# Patient Record
Sex: Female | Born: 2000 | Race: White | Hispanic: No | Marital: Single | Smoking: Never smoker
Health system: Southern US, Community
[De-identification: ages and names within clinical notes are randomized; demographics above are authoritative.]

## PROBLEM LIST (undated history)

## (undated) DIAGNOSIS — R51 Headache: Secondary | ICD-10-CM

## (undated) DIAGNOSIS — B009 Herpesviral infection, unspecified: Secondary | ICD-10-CM

## (undated) HISTORY — DX: Herpesviral infection, unspecified: B00.9

## (undated) HISTORY — DX: Headache: R51

## (undated) HISTORY — PX: WISDOM TOOTH EXTRACTION: SHX21

---
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
## 2019-11-03 ENCOUNTER — Ambulatory Visit: Payer: No Typology Code available for payment source | Attending: Internal Medicine

## 2019-11-03 ENCOUNTER — Other Ambulatory Visit: Payer: Self-pay

## 2019-11-03 DIAGNOSIS — Z20822 Contact with and (suspected) exposure to covid-19: Secondary | ICD-10-CM

## 2019-11-04 LAB — NOVEL CORONAVIRUS, NAA: SARS-CoV-2, NAA: NOT DETECTED

## 2021-02-17 ENCOUNTER — Encounter (INDEPENDENT_AMBULATORY_CARE_PROVIDER_SITE_OTHER): Payer: Self-pay

## 2021-05-03 ENCOUNTER — Ambulatory Visit (INDEPENDENT_AMBULATORY_CARE_PROVIDER_SITE_OTHER): Payer: 59 | Admitting: Family

## 2021-05-03 ENCOUNTER — Encounter (INDEPENDENT_AMBULATORY_CARE_PROVIDER_SITE_OTHER): Payer: Self-pay | Admitting: Family

## 2021-05-03 ENCOUNTER — Other Ambulatory Visit: Payer: Self-pay

## 2021-05-03 VITALS — BP 110/66 | HR 84 | Wt 144.6 lb

## 2021-05-03 DIAGNOSIS — L813 Cafe au lait spots: Secondary | ICD-10-CM | POA: Diagnosis not present

## 2021-05-03 DIAGNOSIS — F419 Anxiety disorder, unspecified: Secondary | ICD-10-CM

## 2021-05-03 DIAGNOSIS — Q8501 Neurofibromatosis, type 1: Secondary | ICD-10-CM

## 2021-05-03 MED ORDER — ALPRAZOLAM 0.25 MG PO TABS: ORAL_TABLET | ORAL | 0 refills | Status: DC

## 2021-05-03 NOTE — Progress Notes (Signed)
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 She also has history of PCOS.   Today's concerns: Grace Strong reports today that she has had increase in number and size of cafe au lait spots. She has appropriate concerns about the increase in cafe au lait spots and wonders if the PCOS is related to this disorder.  Grace Strong is enrolled in an online college with a major in [place] music education. 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   Grace Strong has been otherwise generally healthy since she was last seen. She has no other health concerns today other than previously mentioned.  Review of systems: Please see HPI for neurologic and other pertinent review of systems. Otherwise all other systems were reviewed and were negative.  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  Surgical history: Past Surgical History:  Procedure Laterality  Date   WISDOM TOOTH EXTRACTION     2016     Family history: family history includes Alopecia in her mother; Dementia in her maternal grandfather and paternal grandfather; Infertility in her mother; Kidney Stones in her father; Thyroid disease in her unknown relative; Ulcerative colitis in her mother.   Social history: Social History   Socioeconomic History   Marital status: Single    Spouse name: Not on file   Number of children: Not on file   Years of education: Not on file   Highest education level: Not on file  Occupational History   Not on file  Tobacco Use   Smoking status: Never   Smokeless tobacco: Never  Substance and Sexual Activity   Alcohol use: No   Drug use: No   Sexual activity: Never  Other Topics Concern   Not on file  Social History Narrative   Grace Strong is a 52 th grade student, being home schooled. She does well in school.   Lives with her both parents and two sisters.    Social Determinants of Health   Financial Resource Strain: Not on file  Food Insecurity: Not on file  Transportation Needs: Not on file  Physical Activity: Not on file  Stress: Not on file  Social Connections: Not on file  Intimate Partner Violence: Not on file    Past/failed meds:  Allergies: Allergies  Allergen Reactions   Augmentin [Amoxicillin-Pot Clavulanate] Hives    Immunizations:  There is no immunization history on file for this patient.    Diagnostics/Screenings: Copied from previous record: 05/27/12 -  Normal MRI appearance of the brain.  No imaging findings of neurofibromatosis identified.  Physical Exam: BP 110/66   Pulse 84   Wt 144 lb 9.6 oz (65.6 kg)   LMP 04/19/2021   General: Well developed, well nourished, seated, in no evident distress,  brown hair, hazel eyes, right handed Head: Head normocephalic and atraumatic.  Oropharynx benign. Neck: Supple Cardiovascular: Regular rate and rhythm, no murmurs Respiratory: Breath sounds clear to  auscultation Musculoskeletal: No obvious deformities or scoliosis Skin: No rashes. She has large number of flat cafe au lait spots on her right trunk as well as axillary freckling  Neurologic Exam Mental Status: Awake and fully alert.  Oriented to place and time.  Recent and remote memory intact.  Attention span, concentration, and fund of knowledge appropriate.  Mood and affect appropriate. Cranial Nerves: Fundoscopic exam reveals sharp disc margins.  Pupils equal, briskly reactive to light.  Extraocular movements full without nystagmus.  Visual fields full to confrontation.  Hearing intact and symmetric to finger rub.  Facial sensation intact.  Face tongue, palate move normally and symmetrically.  Neck flexion and extension normal. Motor: Normal bulk and tone. Normal strength in all tested extremity muscles. Sensory: Intact to touch and temperature in all extremities.  Coordination: Rapid alternating movements normal in all extremities.  Finger-to-nose and heel-to shin performed accurately bilaterally.  Romberg negative. Gait and Station: Arises from chair without difficulty.  Stance is normal. Gait demonstrates normal stride length and balance.   Able to heel, toe and tandem walk without difficulty. Reflexes: 1+ and symmetric. Toes downgoing.   Impression: Neurofibromatosis, type 1 (HCC) - Plan: MR BRAIN W WO CONTRAST  Anxiety due to invasive procedure - Plan: ALPRAZolam (XANAX) 0.25 MG tablet  Cafe au lait spots   Recommendations for plan of care: The patient's previous [facility] records were reviewed. Grace Strong has neither had nor required imaging or lab studies since the last visit. She is a 20 year old young woman with history of neurofibromatosis type 1 based on a large number of cafe au lait spots. The lesions have increased in size and number. I am concerned about progression of her disorder and will order an MRI of the brain to evaluate for mass or lesions. She is anxious about the procedure  and I have ordered low dose Alprazolam for her to take prior to the examination. will call Grace Strong when the results are available to me. I will otherwise plan to see Grace Strong back in follow up in 1 year or sooner if needed.   The medication list was reviewed and reconciled. I reviewed changes that were made in the prescribed medications today. A complete medication list was provided to the patient.  Orders Placed This Encounter  Procedures   MR BRAIN W WO CONTRAST    Perform MRI of brain w/wo contrast for patient with known history of neurofibromatosis. Last MRI brain was in 2013. She now has increased number of cafe au lait spots on right trunk and right axillary freckling. Evaluate for mass or lesion.    Standing Status:   Future    Standing Expiration Date:   08/03/2021    Order Specific Question:   If indicated for the ordered procedure, I authorize the administration of contrast media per Radiology protocol    Answer:   Yes    Order Specific Question:   What is the patient's sedation requirement?    Answer:   Anti-anxiety    Order Specific Question:   Does  the patient have a pacemaker or implanted devices?    Answer:   No    Order Specific Question:   Preferred imaging location?    Answer:   [facility] (table limit-550lbs)    Return in about 1 year (around 05/03/2022).   Allergies as of 05/03/2021       Reactions   Augmentin [amoxicillin-pot Clavulanate] Hives   Penicillins Hives   Phenol         Medication List        Accurate as of May 03, 2021 11:59 PM. If you have any questions, ask your nurse or doctor.          acyclovir 200 MG capsule Commonly known as: ZOVIRAX Take 200 mg by mouth as needed.   ALPRAZolam 0.25 MG tablet Commonly known as: Xanax Take 1 tablet 30 minutes prior to procedure. Take additional 1 tablet upon arrival at facility. What changed: additional instructions Changed by: Elveria Rising, NP   Inositol 324 MG Tabs Take as directed  for PCOS What changed: Another medication with the same name was removed. Continue taking this medication, and follow the directions you see here. Changed by: Elveria Rising, NP   Junel FE 1/20 1-20 MG-MCG tablet Generic drug: norethindrone-ethinyl estradiol-FE Take 1 tablet by mouth daily.   multivitamin with minerals tablet Take 1 tablet by mouth daily.   valACYclovir 1000 MG tablet Commonly known as: VALTREX Take 1,000 mg by mouth 2 (two) times daily as needed.   Vitamin D (Ergocalciferol) 1.25 MG (50000 UNIT) Caps capsule Commonly known as: DRISDOL Take 5,000 Units by mouth 3 (three) times a week.        Total time spent with the patient was 20 minutes, of which 50% or more was spent in counseling and coordination of care.  Elveria Rising NP-C [facility] Ph. [phone] Fax [phone]

## 2021-05-03 NOTE — Patient Instructions (Signed)
Thank you for coming in today.   Instructions for you until your next appointment are as follows: I have ordered an MRI of the brain. We will need to get insurance approval, then you will be contacted by the facility to schedule the procedure. Getting insurance approval can take several days to a week, so if you haven't heard anything from them in a week, let me know.  I sent in a prescription for Alprazolam (Xanax) to take 1 tablet before you leave your home for the MRI and then take 1 when you arrive at the MRI facility. This should help you to be calm and able to have the test done.  I will call you when I receive the MRI results.  Please sign up for MyChart if you have not done so. Please plan to return for follow up in one year or sooner if needed.  At [facility], we are committed to providing exceptional care. You will receive a patient satisfaction survey through text or email regarding your visit today. Your opinion is important to me. Comments are appreciated.

## 2021-05-08 ENCOUNTER — Encounter (INDEPENDENT_AMBULATORY_CARE_PROVIDER_SITE_OTHER): Payer: Self-pay | Admitting: Family

## 2021-05-08 DIAGNOSIS — L813 Cafe au lait spots: Secondary | ICD-10-CM | POA: Insufficient documentation

## 2021-05-08 DIAGNOSIS — F419 Anxiety disorder, unspecified: Secondary | ICD-10-CM | POA: Insufficient documentation

## 2021-05-21 ENCOUNTER — Ambulatory Visit
Admission: RE | Admit: 2021-05-21 | Discharge: 2021-05-21 | Disposition: A | Discharge status: Home / Self Care | Payer: 59 | Source: Ambulatory Visit | Attending: Family | Admitting: Family

## 2021-05-21 ENCOUNTER — Other Ambulatory Visit: Payer: Self-pay

## 2021-05-21 DIAGNOSIS — Q8501 Neurofibromatosis, type 1: Secondary | ICD-10-CM

## 2021-05-21 MED ORDER — GADOBENATE DIMEGLUMINE 529 MG/ML IV SOLN
13.0000 mL | Freq: Once | INTRAVENOUS | Status: AC | PRN
  Administered 2021-05-21: 13 mL via INTRAVENOUS

## 2022-04-13 DIAGNOSIS — Z130 Encounter for screening for diseases of the blood and blood-forming organs and certain disorders involving the immune mechanism: Secondary | ICD-10-CM | POA: Diagnosis not present

## 2022-04-13 DIAGNOSIS — Z1389 Encounter for screening for other disorder: Secondary | ICD-10-CM | POA: Diagnosis not present

## 2022-04-13 DIAGNOSIS — Z01419 Encounter for gynecological examination (general) (routine) without abnormal findings: Secondary | ICD-10-CM | POA: Diagnosis not present

## 2022-04-13 DIAGNOSIS — Z3041 Encounter for surveillance of contraceptive pills: Secondary | ICD-10-CM | POA: Diagnosis not present

## 2022-07-03 DIAGNOSIS — L245 Irritant contact dermatitis due to other chemical products: Secondary | ICD-10-CM | POA: Diagnosis not present

## 2022-07-03 DIAGNOSIS — L239 Allergic contact dermatitis, unspecified cause: Secondary | ICD-10-CM | POA: Diagnosis not present

## 2022-07-03 DIAGNOSIS — L309 Dermatitis, unspecified: Secondary | ICD-10-CM | POA: Diagnosis not present

## 2022-07-03 DIAGNOSIS — L280 Lichen simplex chronicus: Secondary | ICD-10-CM | POA: Diagnosis not present

## 2022-07-03 DIAGNOSIS — L308 Other specified dermatitis: Secondary | ICD-10-CM | POA: Diagnosis not present

## 2022-07-03 DIAGNOSIS — L738 Other specified follicular disorders: Secondary | ICD-10-CM | POA: Diagnosis not present

## 2022-08-15 DIAGNOSIS — L853 Xerosis cutis: Secondary | ICD-10-CM | POA: Diagnosis not present

## 2022-08-15 DIAGNOSIS — L738 Other specified follicular disorders: Secondary | ICD-10-CM | POA: Diagnosis not present

## 2022-08-15 DIAGNOSIS — L309 Dermatitis, unspecified: Secondary | ICD-10-CM | POA: Diagnosis not present

## 2022-10-30 ENCOUNTER — Ambulatory Visit (INDEPENDENT_AMBULATORY_CARE_PROVIDER_SITE_OTHER): Payer: 59 | Admitting: Family

## 2022-11-10 IMAGING — MR MR HEAD WO/W CM
12 series · 48 of 48 positions shown · IV contrast (multihance)
Comparison: 05/27/2012

CLINICAL DATA: Neurofibromatosis type 1.

EXAM:
MRI HEAD WITHOUT AND WITH CONTRAST
TECHNIQUE: Multiplanar, multiecho pulse sequences of the brain and surrounding
structures were obtained without and with intravenous contrast.
CONTRAST:  13mL MULTIHANCE GADOBENATE DIMEGLUMINE 529 MG/ML IV SOLN

[Series 2: T1 · sagittal · 5.0mm · 0.45mm/px · 1 of 25 slices shown]
[im 1/25]
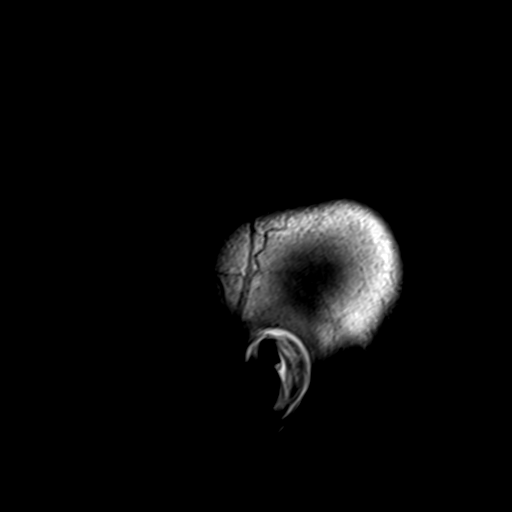

[Series 3: ax ep2d_diff_3 · axial · 3.0mm · 1.80mm/px · z∈[-110,+49]mm · 6 of 110 slices shown]
[im 1/110]
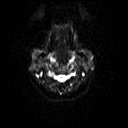
[im 22/110]
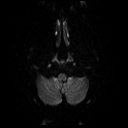
[im 44/110]
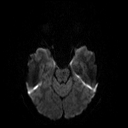
[im 66/110]
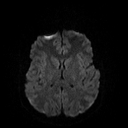
[im 88/110]
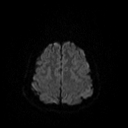
[im 110/110]
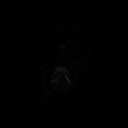

[Series 4: ax ep2d_diff_3_adc · axial · 3.0mm · 1.80mm/px · z∈[-110,+49]mm · 3 of 55 slices shown]
[im 1/55]
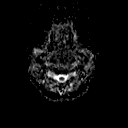
[im 28/55]
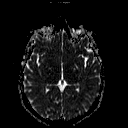
[im 55/55]
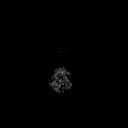

[Series 5: cor ep2d_diff · coronal · 5.0mm · 1.77mm/px · 3 of 51 slices shown]
[im 1/51]
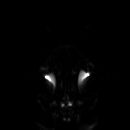
[im 26/51]
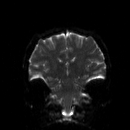
[im 51/51]
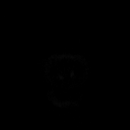

[Series 6: cor ep2d_diff_adc · coronal · 5.0mm · 1.77mm/px · 2 of 27 slices shown]
[im 1/27]
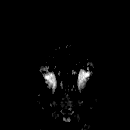
[im 27/27]
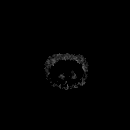

[Series 8: swi_images · axial · 2.0mm · 0.98mm/px · z∈[-110,+45]mm · 5 of 80 slices shown]
[im 1/80]
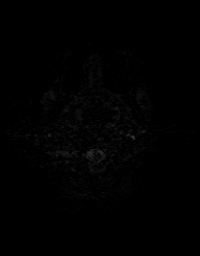
[im 20/80]
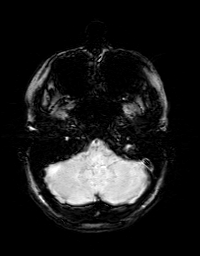
[im 40/80]
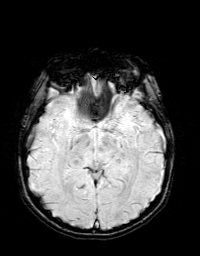
[im 60/80]
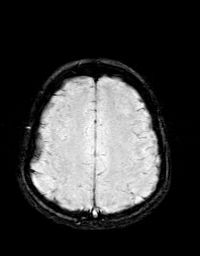
[im 80/80]
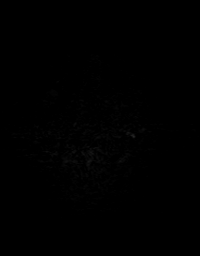

[Series 9: FLAIR · axial · 3.0mm · 0.43mm/px · z∈[-104,+45]mm · 2 of 40 slices shown]
[im 1/40]
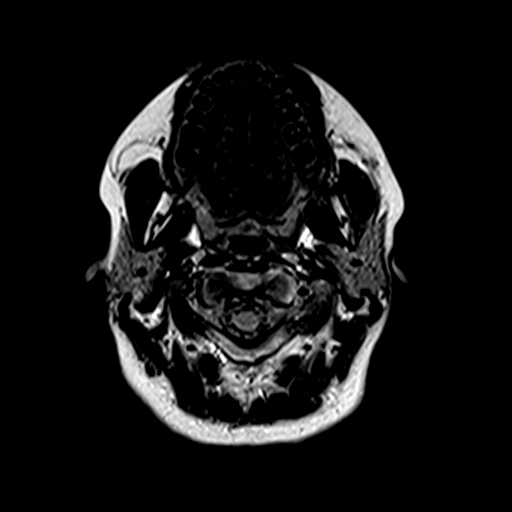
[im 40/40]
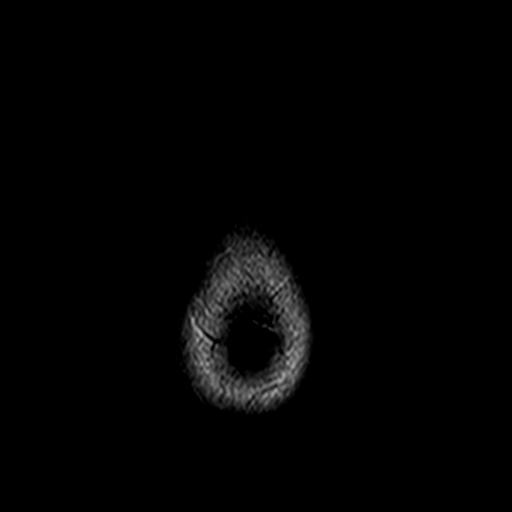

[Series 10: T2 · axial · 5.0mm · 0.65mm/px · z∈[-115,+50]mm · 2 of 29 slices shown (1 of 2)]
[im 1/29]
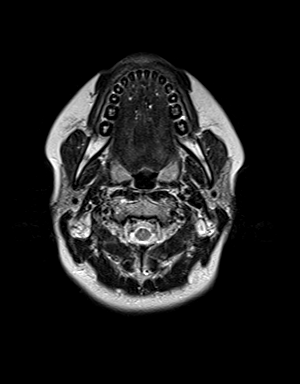
[im 29/29]
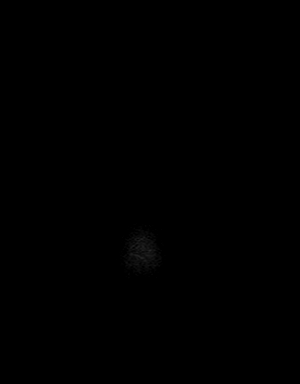

[Series 11: t1_mpr_tra · axial · 1.0mm · 0.72mm/px · z∈[-108,+48]mm · 10 of 160 slices shown]
[im 1/160]
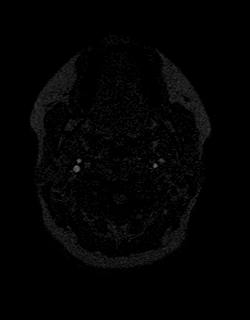
[im 18/160]
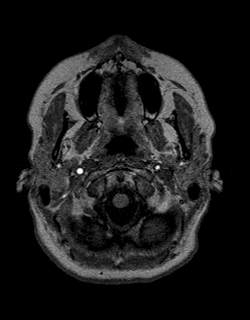
[im 36/160]
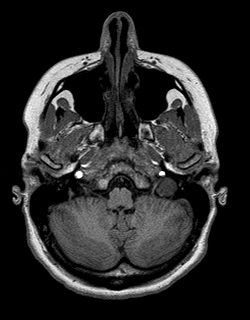
[im 54/160]
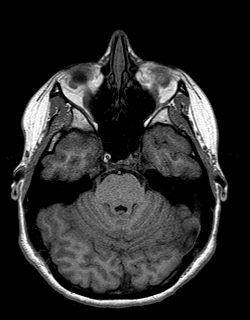
[im 71/160]
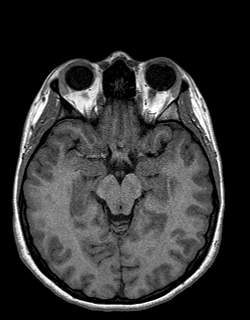
[im 89/160]
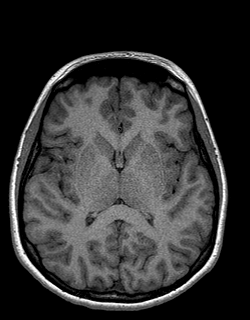
[im 107/160]
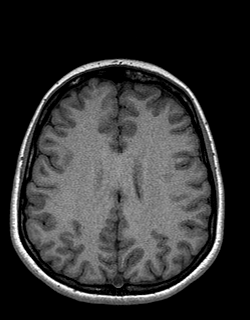
[im 124/160]
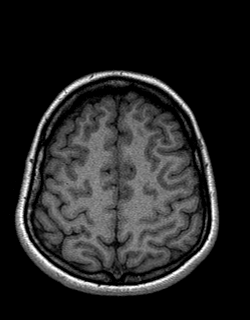
[im 142/160]
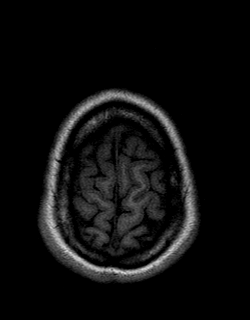
[im 160/160]
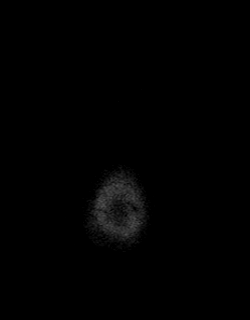

[Series 12: T2 · coronal · 5.0mm · 0.45mm/px · 2 of 31 slices shown (2 of 2)]
[im 1/31]
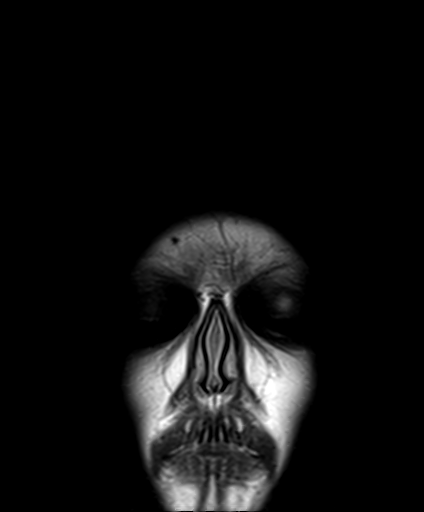
[im 31/31]
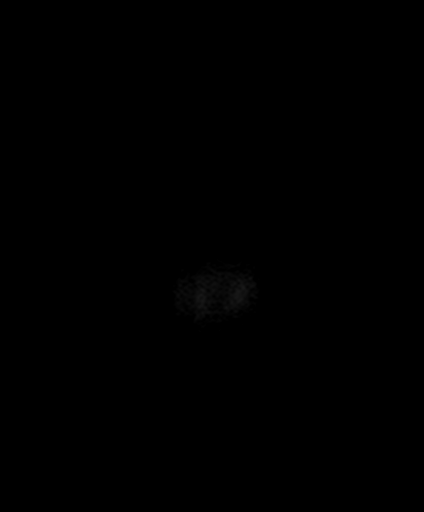

[Series 13: T1 post-contrast · coronal · 5.0mm · 0.72mm/px · 2 of 26 slices shown]
[im 1/26]
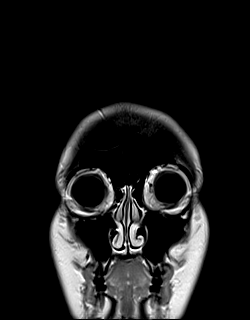
[im 26/26]
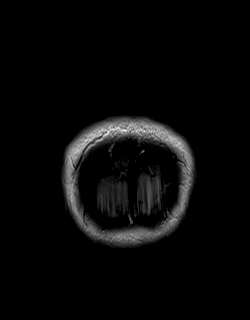

[Series 14: post t1_mpr_tra · axial · 1.0mm · 0.72mm/px · z∈[-108,+48]mm · 10 of 160 slices shown]
[im 1/160]
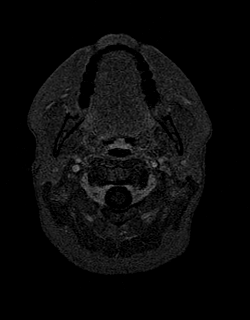
[im 18/160]
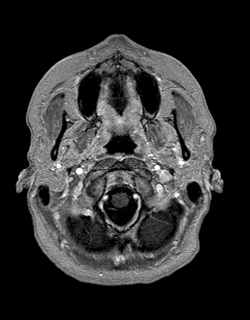
[im 36/160]
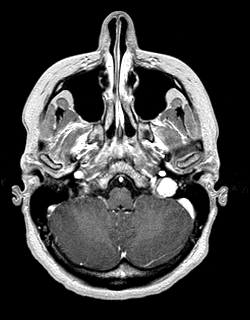
[im 54/160]
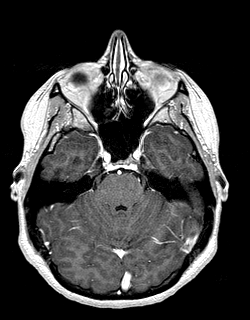
[im 71/160]
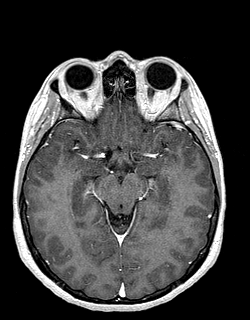
[im 89/160]
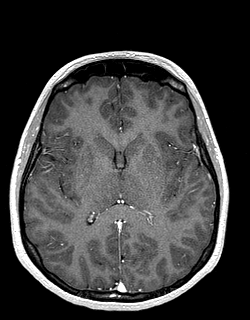
[im 107/160]
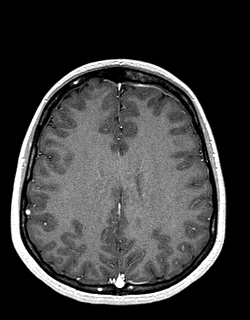
[im 124/160]
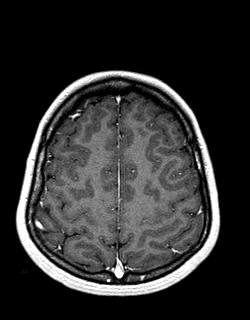
[im 142/160]
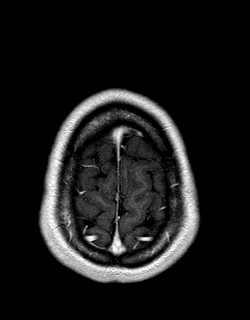
[im 160/160]
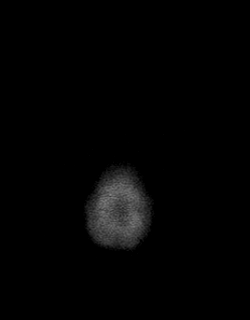

[48 of 48 positions shown; findings below may reference images not displayed]

FINDINGS: Brain: No infarction, hemorrhage, hydrocephalus, extra-axial
collection or mass lesion. No white matter disease or atrophy.

Vascular: Normal flow voids and vessel enhancements

Skull and upper cervical spine: Normal marrow signal

Sinuses/Orbits: New T2 hyperintensity in the left petrous apex
without septal loss, T1 hyperintensity, or restricted diffusion. No
evidence of acute inflammation on postcontrast imaging.
IMPRESSION: 1. Normal MRI of the brain.
2. Interval trapped fluid in the left petrous apex.

## 2022-11-20 ENCOUNTER — Ambulatory Visit (INDEPENDENT_AMBULATORY_CARE_PROVIDER_SITE_OTHER): Payer: 59 | Admitting: Family

## 2022-11-20 ENCOUNTER — Encounter (INDEPENDENT_AMBULATORY_CARE_PROVIDER_SITE_OTHER): Payer: Self-pay | Admitting: Family

## 2022-11-20 VITALS — BP 110/74 | HR 80 | Ht 68.11 in | Wt 146.4 lb

## 2022-11-20 DIAGNOSIS — Q8501 Neurofibromatosis, type 1: Secondary | ICD-10-CM | POA: Diagnosis not present

## 2022-11-20 DIAGNOSIS — G44219 Episodic tension-type headache, not intractable: Secondary | ICD-10-CM | POA: Diagnosis not present

## 2022-11-20 DIAGNOSIS — L813 Cafe au lait spots: Secondary | ICD-10-CM | POA: Diagnosis not present

## 2022-11-20 NOTE — Patient Instructions (Signed)
It was a pleasure to see you today!  Instructions for you until your next appointment are as follows: I will refer you to [facility] for adult neurology care. Their phone number is [phone] Call me for any concerns that may arise prior to your transition to that practice. Please sign up for MyChart if you have not done so.   Feel free to contact our office during normal business hours at [phone] with questions or concerns. If there is no answer or the call is outside business hours, please leave a message and our clinic staff will call you back within the next business day.  If you have an urgent concern, please stay on the line for our after-hours answering service and ask for the on-call neurologist.     I also encourage you to use MyChart to communicate with me more directly. If you have not yet signed up for MyChart within [facility], the front desk staff can help you. However, please note that this inbox is NOT monitored on nights or weekends, and response can take up to 2 business days.  Urgent matters should be discussed with the on-call pediatric neurologist.   At [facility], we are committed to providing exceptional care. You will receive a patient satisfaction survey through text or email regarding your visit today. Your opinion is important to me. Comments are appreciated.

## 2022-11-20 NOTE — Progress Notes (Signed)
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 She also has history of PCOS.    Today's concerns: Grace Strong feels that she has a few more spots on her right trunk but they are very small and not raised. None of the prior spots or axillary freckling has enlarged or changed.  Has occasional tension headaches but attributes to busy college and work schedule.  Will graduate college in May. 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  Has known PCOS and is taking oral contraceptives for that. Grace Strong has been otherwise generally healthy since she was last seen. No health concerns today other than previously mentioned.  Review of systems: Please see HPI for neurologic and other pertinent review of systems. Otherwise all other systems were reviewed and were negative.  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   Surgical history: Past Surgical History:  Procedure Laterality Date   WISDOM TOOTH EXTRACTION     2016     Family history: family history includes Alopecia in her mother; Dementia in her maternal grandfather and paternal grandfather; Infertility in her mother; Kidney Stones in her father; Thyroid disease in an other family member; Ulcerative colitis in her mother.   Social history: Social History   Socioeconomic History   Marital status: Single    Spouse name: Not on file   Number of children: Not on file   Years of education: Not on file   Highest education level: Not on file  Occupational History   Not on file  Tobacco Use   Smoking status: Never   Smokeless tobacco: Never  Substance and Sexual Activity   Alcohol use: No   Drug use: No   Sexual activity: Never  Other Topics Concern   Not on file  Social History Narrative   Grace Strong is attending [employer] for [employer] and [employer].    She lives with mom, dad, and 2 sisters.    Social Determinants of Health   Financial Resource Strain: Not on file  Food Insecurity: Not on file  Transportation Needs: Not on file  Physical Activity: Not on file  Stress: Not on file  Social Connections: Not on file  Intimate Partner Violence: Not on file    Past/failed meds:  Allergies: Allergies  Allergen Reactions   Augmentin [Amoxicillin-Pot Clavulanate] Hives  Penicillins Hives   Phenol     Immunizations:  There is no immunization history on file for this patient.    Diagnostics/Screenings: Copied from previous record: 05/21/2021 - MRI brain wo contrast -  1. Normal MRI of the brain. 2. Interval trapped fluid in the left petrous apex.  05/27/12 - MRI brwon wo contrast - Normal MRI appearance of the brain.  No imaging findings of neurofibromatosis identified.   Physical Exam: BP 110/74 (BP Location: Left Arm, Patient Position: Sitting, Cuff Size: Normal)    Pulse 80   Ht 5' 8.11" (1.73 m)   Wt 146 lb 6.4 oz (66.4 kg)   BMI 22.19 kg/m   General: Well developed, well nourished young woman, seated on exam table, in no evident distress Head: Head normocephalic and atraumatic.  Oropharynx benign. Neck: Supple Cardiovascular: Regular rate and rhythm, no murmurs Respiratory: Breath sounds clear to auscultation Musculoskeletal: No obvious deformities or scoliosis Skin: No rashes. Has scattered cafe au lait spots on her right trunk as well as right axillary freckling  Neurologic Exam Mental Status: Awake and fully alert.  Oriented to place and time.  Recent and remote memory intact.  Attention span, concentration, and fund of knowledge appropriate.  Mood and affect appropriate. Cranial Nerves: Fundoscopic exam reveals sharp disc margins.  Pupils equal, briskly reactive to light.  Extraocular movements full without nystagmus. Hearing intact and symmetric to whisper.  Facial sensation intact.  Face tongue, palate move normally and symmetrically. Shoulder shrug normal Motor: Normal bulk and tone. Normal strength in all tested extremity muscles. Sensory: Intact to touch and temperature in all extremities.  Coordination: Rapid alternating movements normal in all extremities.  Finger-to-nose and heel-to shin performed accurately bilaterally.  Romberg negative. Gait and Station: Arises from chair without difficulty.  Stance is normal. Gait demonstrates normal stride length and balance.   Able to heel, toe and tandem walk without difficulty. Reflexes: 1+ and symmetric. Toes downgoing.   Impression: Neurofibromatosis, type 1 ([place]) - Plan: Ambulatory referral to Neurology  Episodic tension-type headache, not intractable - Plan: Ambulatory referral to Neurology  Cafe au lait spots - Plan: Ambulatory referral to Neurology   Recommendations for plan of care: The patient's previous Epic records were reviewed. No recent diagnostic studies to be reviewed with the  patient.  Plan until next visit: Continue monitoring cafe-au-lait spots Call for any concerns Will refer to [facility] for transfer of care to adult neurology provider  The medication list was reviewed and reconciled. No changes were made in the prescribed medications today. A complete medication list was provided to the patient.  Orders Placed This Encounter  Procedures   Ambulatory referral to Neurology    Referral Priority:   Routine    Referral Type:   Consultation    Referral Reason:   Specialty Services Required    Requested Specialty:   Neurology    Number of Visits Requested:   1    Allergies as of 11/20/2022       Reactions   Augmentin [amoxicillin-pot Clavulanate] Hives   Penicillins Hives   Phenol         Medication List        Accurate as of November 20, 2022 11:27 AM. If you have any questions, ask your nurse or doctor.          STOP taking these medications    acyclovir 200 MG capsule Commonly known as: ZOVIRAX Stopped by: Rockwell Germany, NP   ALPRAZolam 0.25  MG tablet Commonly known as: Xanax Stopped by: Rockwell Germany, NP   Inositol 324 MG Tabs Stopped by: Rockwell Germany, NP   valACYclovir 1000 MG tablet Commonly known as: VALTREX Stopped by: Rockwell Germany, NP       TAKE these medications    Junel FE 1/20 1-20 MG-MCG tablet Generic drug: norethindrone-ethinyl estradiol-FE Take 1 tablet by mouth daily.   multivitamin with minerals tablet Take 1 tablet by mouth daily.   Vitamin D (Ergocalciferol) 1.25 MG (50000 UNIT) Caps capsule Commonly known as: DRISDOL Take 5,000 Units by mouth 3 (three) times a week.      Total time spent with the patient was 20 minutes, of which 50% or more was spent in counseling and coordination of care.  Rockwell Germany NP-C [facility] [address], [place], [place] [address] Ph. [phone] Fax [phone]

## 2022-11-27 ENCOUNTER — Encounter (INDEPENDENT_AMBULATORY_CARE_PROVIDER_SITE_OTHER): Payer: Self-pay

## 2022-11-27 DIAGNOSIS — Q8501 Neurofibromatosis, type 1: Secondary | ICD-10-CM

## 2022-11-27 DIAGNOSIS — L813 Cafe au lait spots: Secondary | ICD-10-CM

## 2022-11-28 NOTE — Telephone Encounter (Signed)
I called and spoke with patient. I explained about genetic testing and recommended referral to Dr Retta Mac for genetics evaluation. She agreed with this plan. TG

## 2023-01-03 DIAGNOSIS — H109 Unspecified conjunctivitis: Secondary | ICD-10-CM | POA: Diagnosis not present

## 2023-01-03 DIAGNOSIS — J029 Acute pharyngitis, unspecified: Secondary | ICD-10-CM | POA: Diagnosis not present

## 2023-01-29 NOTE — Progress Notes (Signed)
MEDICAL GENETICS NEW PATIENT EVALUATION  Patient name: Grace Strong DOB: January 22, 2001 Age: 22 y.o. 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 female who presents today for an initial genetics evaluation for cafe au lait macules + axillary freckling (clinical dx NF1). She is accompanied by her mother at today's visit.  Grace Strong was clinically diagnosed with neurofibromatosis type 1 at 22 yo after she began developing multiple cafe au laits on her right trunk and right axillary freckling. The cafe au laits have increased in number (dozens) over time but slowed down in her teenage years. All are isolated to her right trunk. She has not had any neurofibroma growths. Grace Strong follows with neurology Elveria Rising, NP, planning to transfer to adult care at [facility]) and has had normal brain MRIs in 2022 and 2013. Last BP was normal. She has not had a recent eye exam and the family is unsure if she has Lisch nodules.   Grace Strong also has PCOS. She is currently on a low dose oral contraceptive- they have been treating conservatively due to concern that higher hormone levels (particularly progesterone) would cause neurofibroma growth. There are no developmental or learning concerns.  Prior genetic testing has not been performed. Grace Strong recently came across Legius syndrome and wondered if it was possible she could have this, rather than NF1. She also had questions about recurrence risk. She was therefore referred to Genetics.  Pregnancy/Birth History: Grace Strong was born to a then 23 year old G1P0 -> 1 mother. The pregnancy was conceived with aid of clomid and was uncomplicated. There were no exposures and labs were normal. Ultrasounds were normal. Amniotic fluid levels were normal. Fetal activity was  normal. No genetic testing was performed during the pregnancy.  Grace Strong was born at [redacted] week gestation at [facility] via vaginal delivery. There were complications- shoulder dystocia and right torticollis with flattening of right side of face, congenital agenesis of depressor angulare oris. Birth weight 7 lb 14 oz/3572 grams (75-90%), birth length and head circumference unknown. She did not require a NICU stay. She was discharged home a couple days after birth. She passed the newborn screen, hearing test and congenital heart screen.  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  Therapies -- none.  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  Social History: Social History   Social History Narrative   Wendy is attending [employer] [place] for Music and [employer].    She lives with mom, dad, and 2 sisters.  Medications: Current Outpatient Medications on File Prior to Visit  Medication Sig Dispense Refill   JUNEL FE 1/20 1-20 MG-MCG tablet Take 1 tablet by mouth daily.     Multiple Vitamins-Minerals (MULTIVITAMIN WITH MINERALS) tablet Take 1 tablet by mouth  daily.     spironolactone (ALDACTONE) 25 MG tablet Take 25 mg by mouth daily.     Vitamin D, Ergocalciferol, (DRISDOL) 50000 UNITS CAPS capsule Take 5,000 Units by mouth 3 (three) times a week.     No current facility-administered medications on file prior to visit.    Allergies:  Allergies  Allergen Reactions   Augmentin [Amoxicillin-Pot Clavulanate] Hives   Penicillins Hives   Phenol     Immunizations: up to date  Review of Systems: General: Normal growth and development. Eyes/vision: no concerns. No recent formal eye exam. Unsure if ever told she has Lisch nodules in the past. No known optic nerve issues. Ears/hearing: no concerns. Dental: sees dentist. No concerns. Respiratory: no concerns. Cardiovascular: no concerns. Gastrointestinal: no concerns. Genitourinary: no concerns. Endocrine: PCOS. Hematologic: no concerns. Immunologic: no concerns. Sick a lot recently in past 6 months. Neurological: no concerns. Normal brain MRIs. Psychiatric: no concerns, no learning issues or ADHD. Musculoskeletal: mild scoliosis. R breast smaller than L. 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 Skin, Hair, Nails: freckling throughout body. R axillary freckling, R trunk cafe au laits. Dermatologist is Dr. Amy Swaziland with [facility]. Cheeks used to always be red- had lasering done.  Family History: See pedigree below obtained during today's visit:    Notable family history: Areona is one of three children between her parents. She has a 72 yo sister who has bowel malrotation, GERD, and scoliosis that has been progressive and will likely require some form of intervention. There is a 76 yo sister who has juvenile arthritis. Neither sister has cafe au laits or freckling. There was a miscarriage in the sibship found to be triploidy. The mother is 55 yo, 5'4", and has ankylosing spondylosis. She has freckling in her right axilla; no cafe au laits; no  neurofibromas. The father is 44 yo, 6'4", and has hypertension and many moles.  Family history is notable for maternal grandmother who had a breast lump biopsied and found to be a neurofibroma; she does not otherwise have cafe au laits, freckling, or other neurofibromas. Both paternal aunts have Factor V Leiden. One aunt has severe endometriosis and a mesentery tumor. The paternal grandmother has had melanoma and breast cancer (diagnosed late 71s). The paternal grandmother has early onset Alzheimer's.   No one else in the family has been diagnosed with NF1 or has reported features that would be consistent with a clinical concern for NF1.  Mother's ethnicity: White Father's ethnicity: White Consanguinity: Denies, though parents are reportedly from the same community  Physical Examination: Weight: 67.5 kg (75-90%) Height: 5'8" (93%); mid-parental ~90% Head circumference: 56 cm (~75%)  Ht 5' 8.03" (1.728 m)   Wt 148 lb 12.8 oz (67.5 kg)   HC 56 cm (22.05")   BMI 22.60 kg/m   General: Alert, interactive Head: Normocephalic Eyes: Normoset, Normal lids, lashes, brows Nose: Normal appearance Lips/Mouth/Teeth: Normal appearance (no pigmentation differences within mouth or on insides of cheek) Ears: Normoset and normally formed, no pits, tags or creases Neck: Normal appearance Chest: No pectus deformities, right breast is smaller than left (she puts extra padding on the right side to fill out the bra cup) Heart: Warm and well perfused Lungs: No  increased work of breathing Skin:  Copious amounts of brown freckles primarily on right upper side of body -- arm/chest/abdomen/upper back/flank -- that very slightly cross midline towards the left on the anterior + posterior aspects of her trunk There are also many cafe au lait macules (definitely >6 that are >15 mm in diameter with smooth borders) scattered amongst the freckles on the right side of the body +Axillary freckling on the right side  only NO inguinal freckling bilaterally There are also scattered freckles on other areas of the body but more sporadic and not numerous No palpable neurofibromas or skin tags Hair: Normal anterior and posterior hairline, normal texture Neurologic: Normal gross motor by observation, no abnormal movements Psych: Age-appropriate interactions Back/spine: Very mild scoliosis Extremities: Symmetric and proportionate Hands/Feet: Normal hands, fingers and nails, 2 palmar creases bilaterally, Normal feet, toes and nails, No clinodactyly, syndactyly or polydactyly  Photos of patient in Epic (verbal consent obtained)  Prior Genetic testing: None  Pertinent Labs: None  Pertinent Imaging/Studies: Brain MRI 05/2021: normal brain  Assessment: OLETHA TOLSON is a 22 y.o. female with freckles and cafe au lait macules isolated to the right upper area of her body. These were first noticed at a young age and have grown in quantity/size as she has gotten older. She also has mild scoliosis and her right breast is smaller compared to the left. She is developmentally and intellectually typical without learning difficulties. She does not have macrocephaly. Family history is notable for her mother who has unilateral axillary freckling only as well as her maternal grandmother having a single neurofibroma in the breast (but no skin findings).  Cafe au lait macules can be seen in a variety of genetic disorders, including neurofibromatosis type 1, Legius syndrome, Noonan syndrome with multiple lentigines, Cowden syndrome, McCune Albright and Tuberous Sclerosis.    Neurofibromatosis 1 (NF1) should be suspected in individuals who have any of the following findings: Six or more caf au lait macules >5 mm in greatest diameter in prepubertal individuals and >15 mm in greatest diameter in postpubertal individuals Two or more neurofibromas of any type or one plexiform neurofibroma Freckling in the axillary or inguinal  regions Optic glioma Two or more Lisch nodules (iris hamartomas) A distinctive osseous lesion such as sphenoid dysplasia or tibial pseudarthrosis A first-degree relative (parent, sib, or offspring) with NF1 as defined by the above criteria   The NIH diagnostic criteria for NF1 are met in an individual who has two or more of the features OR one feature + a parent has a known diagnosis of NF1.   Verne technically does meet clinical criteria for NF1 (multiple cafe au lait macules, axillary freckling). We recommend testing of the NF1 gene in [place] to determine if we can identify a pathogenic variant that is the cause of her symptoms. There are other conditions associated with cafe au lait macules, including Legius syndrome (caused by variants in SPRED1). Given the overlap of this condition with NF1, we will test for variants in SPRED1 as well. If a variant is identified in SPRED1, then differing management recommendations may be provided because these individuals do not have risk of developing neurofibromas. I do not see evidence of other syndromes such as Noonan syndrome with multiple lentigines, Cowden syndrome, McCune Albright, and Tuberous Sclerosis so will defer additional assessment for these conditions at this time.  It is notable that the skin findings in Abriella appear to be isolated to the right trunk (anterior + posterior) and axilla of  her body. Some of the freckling on her stomach and back slightly cross midline but there is a distinct delineation. She has no left-sided axillary freckling, no inguinal freckling on either side, no h/o neurofibromas, no macrocephaly and no learning/behavior differences. This may be suggestive of mosaicism (segmental), in which a variant is present in only the cells of a certain region of the body, but not in the rest of the body. Given this, if a variant is not present in her buccal sample (which is highly likely since her buccal tissue appeared uninvolved), it is  possible the test could come back negative but there still be a variant in one of these genes present in other affected cell tissues that were not tested. In this case, testing an affected skin sample through skin biopsy could be considered, but risks of this procedure and consideration of how this would support management decisions should be carefully discussed. Ideally, the skin sample could be obtained through her Dermatologist and contain a cafe au lait region. 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    Recommendations: NF1 and SPRED1 single gene testing on a buccal sample If negative, can consider testing an affected skin sample (would need assistance through Dermatology for skin biopsy) If negative, Amirrah would still technically meet clinical criteria for NF1  A buccal sample was obtained during today's visit for the above genetic testing and sent to [facility]. Results are anticipated in 2-4 weeks. We will contact the family to discuss results once available and arrange follow-up as needed.    Charline Bills, MS, [facility] Certified Genetic Counselor  Loletha Grayer, D.O. Attending Physician, Medical [facility] Date: 02/13/2023 Time: 3:50pm   Total time spent: 90 minutes Time spent includes face to face and non-face to face care for the patient on the date of this encounter (history and physical, genetic counseling, coordination of care, data gathering and/or documentation as outlined)

## 2023-01-31 ENCOUNTER — Ambulatory Visit (INDEPENDENT_AMBULATORY_CARE_PROVIDER_SITE_OTHER): Payer: 59 | Admitting: Pediatric Genetics

## 2023-01-31 ENCOUNTER — Encounter (INDEPENDENT_AMBULATORY_CARE_PROVIDER_SITE_OTHER): Payer: Self-pay | Admitting: Pediatric Genetics

## 2023-01-31 VITALS — Ht 68.03 in | Wt 148.8 lb

## 2023-01-31 DIAGNOSIS — L813 Cafe au lait spots: Secondary | ICD-10-CM | POA: Diagnosis not present

## 2023-01-31 DIAGNOSIS — M419 Scoliosis, unspecified: Secondary | ICD-10-CM | POA: Diagnosis not present

## 2023-01-31 DIAGNOSIS — L812 Freckles: Secondary | ICD-10-CM | POA: Diagnosis not present

## 2023-01-31 NOTE — Patient Instructions (Addendum)
At [facility], we are committed to providing exceptional care. You will receive a patient satisfaction survey through text or email regarding your visit today. Your opinion is important to me. Comments are appreciated.  Test ordered: NF1 gene and SPRED1 gene to [facility] on a cheek swab Result expected in 2-4 weeks  If normal, we can talk about testing an "affected" skin sample next.

## 2023-03-01 DIAGNOSIS — L812 Freckles: Secondary | ICD-10-CM | POA: Diagnosis not present

## 2023-03-01 DIAGNOSIS — L813 Cafe au lait spots: Secondary | ICD-10-CM | POA: Diagnosis not present

## 2023-03-08 ENCOUNTER — Telehealth (INDEPENDENT_AMBULATORY_CARE_PROVIDER_SITE_OTHER): Payer: Self-pay | Admitting: Genetic Counselor

## 2023-03-08 NOTE — Telephone Encounter (Signed)
Spoke to [employer] regarding result of genetic testing. Testing of the NF1 and SPRED1 genes through [facility] on a buccal swab was performed and was negative. This is unsurprising given that Grace Strong's skin findings are mainly isolated to the right trunk suggesting segmental mosaicism. We discussed the option of skin biopsy and genetic testing of the affected area, and Grace Strong is interested. We will coordinate a plan with her dermatologist, and I will discuss with Valecia when we have a plan. Grace Strong voiced understanding. The result was shared with her through the [facility] portal.  Charline Bills, [facility]

## 2023-03-14 ENCOUNTER — Telehealth (INDEPENDENT_AMBULATORY_CARE_PROVIDER_SITE_OTHER): Payer: Self-pay | Admitting: Pediatric Genetics

## 2023-03-14 NOTE — Telephone Encounter (Signed)
Left message for Dr. Amy Swaziland at [facility] to discuss possibility of obtaining an affected skin sample for genetic testing for Grace Strong.  Loletha Grayer, DO [facility]

## 2023-03-20 ENCOUNTER — Encounter (INDEPENDENT_AMBULATORY_CARE_PROVIDER_SITE_OTHER): Payer: Self-pay | Admitting: Pediatric Genetics

## 2023-04-24 DIAGNOSIS — Z3041 Encounter for surveillance of contraceptive pills: Secondary | ICD-10-CM | POA: Diagnosis not present

## 2023-04-24 DIAGNOSIS — Z01411 Encounter for gynecological examination (general) (routine) with abnormal findings: Secondary | ICD-10-CM | POA: Diagnosis not present

## 2023-04-24 DIAGNOSIS — L700 Acne vulgaris: Secondary | ICD-10-CM | POA: Diagnosis not present

## 2023-04-24 DIAGNOSIS — F418 Other specified anxiety disorders: Secondary | ICD-10-CM | POA: Diagnosis not present

## 2023-04-24 DIAGNOSIS — Z1389 Encounter for screening for other disorder: Secondary | ICD-10-CM | POA: Diagnosis not present

## 2023-06-20 DIAGNOSIS — E282 Polycystic ovarian syndrome: Secondary | ICD-10-CM | POA: Diagnosis not present

## 2023-06-20 DIAGNOSIS — Z0000 Encounter for general adult medical examination without abnormal findings: Secondary | ICD-10-CM | POA: Diagnosis not present

## 2023-06-20 DIAGNOSIS — Z23 Encounter for immunization: Secondary | ICD-10-CM | POA: Diagnosis not present

## 2023-06-20 DIAGNOSIS — Z13220 Encounter for screening for lipoid disorders: Secondary | ICD-10-CM | POA: Diagnosis not present

## 2023-06-20 DIAGNOSIS — Z8349 Family history of other endocrine, nutritional and metabolic diseases: Secondary | ICD-10-CM | POA: Diagnosis not present

## 2023-06-20 DIAGNOSIS — B009 Herpesviral infection, unspecified: Secondary | ICD-10-CM | POA: Diagnosis not present

## 2023-06-20 DIAGNOSIS — L700 Acne vulgaris: Secondary | ICD-10-CM | POA: Diagnosis not present

## 2023-06-20 DIAGNOSIS — Q8500 Neurofibromatosis, unspecified: Secondary | ICD-10-CM | POA: Diagnosis not present

## 2023-06-20 DIAGNOSIS — Z833 Family history of diabetes mellitus: Secondary | ICD-10-CM | POA: Diagnosis not present

## 2023-06-20 DIAGNOSIS — F418 Other specified anxiety disorders: Secondary | ICD-10-CM | POA: Diagnosis not present

## 2023-11-12 ENCOUNTER — Telehealth: Payer: Self-pay | Admitting: Neurology

## 2023-11-12 NOTE — Telephone Encounter (Signed)
Pt called to confirm appointment.

## 2023-11-19 ENCOUNTER — Encounter: Payer: Self-pay | Admitting: Neurology

## 2023-11-19 ENCOUNTER — Ambulatory Visit (INDEPENDENT_AMBULATORY_CARE_PROVIDER_SITE_OTHER): Payer: 59 | Admitting: Neurology

## 2023-11-19 VITALS — BP 128/74 | HR 78 | Ht 68.5 in | Wt 150.0 lb

## 2023-11-19 DIAGNOSIS — Q859 Phakomatosis, unspecified: Secondary | ICD-10-CM | POA: Diagnosis not present

## 2023-11-19 NOTE — Progress Notes (Signed)
Chief Complaint  Patient presents with   New Patient (Initial Visit)    Pt in 14, here alone Pt is referred for neurofibromatosis type 1 and migraines. Pt states she needs to get established with a neurologist.        ASSESSMENT AND PLAN  Grace Strong is a 23 y.o. female   Neurocutaneous condition  Possible neurofibromatosis or its variant  Presenting with gradually increased  caf au lait macules, intertriginous freckling, but only limited to right thoracic segment, there was no neurofibroma or other typical features such as family history, optic glioma, osseous lesion identified   She is doing well, no focal neurological symptoms other than the skin caf au lait macules  She is concerned about future family planning, discussed with patient, since she is currently doing well, also concerned about the high cost associated with genetic testing, we will continue observe her symptoms, will return to clinic if any new symptoms develop or planning on proceed with genetic testing,  Related information was provided  DIAGNOSTIC DATA (LABS, IMAGING, TESTING) - I reviewed patient records, labs, notes, testing and imaging myself where available.   MEDICAL HISTORY:  Grace Strong is a 23 year old pleasant female, seen in request by her primary care Dr. Jacqulyn Bath, Rinka to follow-up neurocutaneous condition, was seen by pediatric neurologist Dr. Sharene Skeans, later next partitioning   Grace Strong, in the past,  History is obtained from the patient and review of electronic medical records. I personally reviewed pertinent available imaging films in PACS.   PMHx of  Acne  Around age 49, she was noted to have excessive amount of caf au lait macule at the right side of her trunk, but there was no family history of neurofibroma, no other focal symptoms  She has been followed by pediatric neurologist over the years, had MRI of the brain with without contrast April 2005 that was normal,  periodically repeating MRI of the brain, most recent in August 2022, that was normal as well  Over the years, she noticed increased dark brown spots involving on the right side of her body, mild extending to right axillary, and inner upper arm, never developed typical neurofibroma, or other symptoms, such as Lisch nodule with her regular ophthalmology evaluation, no bony abnormality  She is currently Engineer, agricultural, function well,  Since teenager, aft she started mense, she developed irregular menstruation cycle, depression, weight gain, was diagnosed with polycystic ovarian disease, started birth control around age 89, her weight is well-controlled by exercise routine and diet,  She currently works as a Engineer, agricultural, no limitation in her daily activity  Around 2024, concerning about future family planning, she was seen by geneticist, offered buccal mucosa swab, there was no positive genetic variation identified,   She is graduating from her pediatric neurology service, hope to establish with adult neurology care,  PHYSICAL EXAM:   Vitals:   11/19/23 0927  BP: 128/74  Pulse: 78  Weight: 150 lb (68 kg)  Height: 5' 8.5" (1.74 m)   Not recorded     Body mass index is 22.48 kg/m.  PHYSICAL EXAMNIATION:  Gen: NAD, conversant, well nourised, well groomed                     Cardiovascular: Regular rate rhythm, no peripheral edema, warm, nontender. Eyes: Conjunctivae clear without exudates or hemorrhage Neck: Supple, no carotid bruits. Pulmonary: Clear to auscultation bilaterally  Skin: Multiple light brown-colored caf au lait macule involving only the  right thoracic segment, extending to right inguinal arm, with right armpit involvement, span  from T1-T12  NEUROLOGICAL EXAM:  MENTAL STATUS: Speech/cognition: Awake, alert, oriented to history taking and casual conversation CRANIAL NERVES: CN II: Visual fields are full to confrontation. Pupils are round equal and briskly  reactive to light. CN III, IV, VI: extraocular movement are normal. No ptosis. CN V: Facial sensation is intact to light touch CN VII: Face is symmetric with normal eye closure  CN VIII: Hearing is normal to causal conversation. CN IX, X: Phonation is normal. CN XI: Head turning and shoulder shrug are intact  MOTOR: There is no pronator drift of out-stretched arms. Muscle bulk and tone are normal. Muscle strength is normal.  REFLEXES: Reflexes are 2+ and symmetric at the biceps, triceps, knees, and ankles. Plantar responses are flexor.  SENSORY: Intact to light touch, pinprick and vibratory sensation are intact in fingers and toes.  COORDINATION: There is no trunk or limb dysmetria noted.  GAIT/STANCE: Posture is normal. Gait is steady with normal steps, base, arm swing, and turning. Heel and toe walking are normal. Tandem gait is normal.  Romberg is absent.  REVIEW OF SYSTEMS:  Full 14 system review of systems performed and notable only for as above All other review of systems were negative.   ALLERGIES: Allergies  Allergen Reactions   Augmentin [Amoxicillin-Pot Clavulanate] Hives   Penicillins Hives   Phenol     HOME MEDICATIONS: Current Outpatient Medications  Medication Sig Dispense Refill   JUNEL FE 1/20 1-20 MG-MCG tablet Take 1 tablet by mouth daily.     Multiple Vitamins-Minerals (MULTIVITAMIN WITH MINERALS) tablet Take 1 tablet by mouth daily.     spironolactone (ALDACTONE) 25 MG tablet Take 25 mg by mouth daily.     UNABLE TO FIND Nutrafol for hairloss     Vitamin D, Ergocalciferol, (DRISDOL) 50000 UNITS CAPS capsule Take 5,000 Units by mouth 3 (three) times a week.     valACYclovir (VALTREX) 500 MG tablet Take 500 mg by mouth 2 (two) times daily. (Patient not taking: Reported on 11/19/2023)     No current facility-administered medications for this visit.    PAST MEDICAL HISTORY: Past Medical History:  Diagnosis Date   Headache(784.0)    HSV-1 (herpes  simplex virus 1) infection    Described by mother as recurrent fever blisters    PAST SURGICAL HISTORY: Past Surgical History:  Procedure Laterality Date   WISDOM TOOTH EXTRACTION     2016    FAMILY HISTORY: Family History  Problem Relation Age of Onset   Ulcerative colitis Mother    Alopecia Mother    Infertility Mother        Took clomind for first pregnany, did not for second   Kidney Stones Father    Dementia Maternal Grandfather    Dementia Paternal Grandfather    Thyroid disease Other     SOCIAL HISTORY: Social History   Socioeconomic History   Marital status: Single    Spouse name: Not on file   Number of children: Not on file   Years of education: Not on file   Highest education level: Not on file  Occupational History   Occupation: Warden/ranger  Tobacco Use   Smoking status: Never   Smokeless tobacco: Never  Vaping Use   Vaping status: Never Used  Substance and Sexual Activity   Alcohol use: No   Drug use: No   Sexual activity: Never  Other Topics Concern   Not  on file  Social History Narrative   Breanda is attending [address] [place] for [employer] and [employer].    She lives with mom, dad, and 2 sisters.    Social Drivers of Corporate investment banker Strain: Not on file  Food Insecurity: Not on file  Transportation Needs: Not on file  Physical Activity: Not on file  Stress: Not on file  Social Connections: Not on file  Intimate Partner Violence: Not on file      Levert Feinstein, M.D. Ph.D.  [facility] [address], [address] [place], [place] [address] Ph: [phone] Fax: [phone]  CC:  Grace Rising, NP [address] [place],  [place] [address]  Ollen Bowl, MD

## 2023-11-19 NOTE — Patient Instructions (Addendum)
TaxDiscussions.tn  [email or web address]_Counseling  [email or web address]  Mosaic NF1 (i.e., somatic mosaicism for an NF1 pathogenic variant) may present as clinical features of NF1 localized to one or more segments of the body or as typical (generalized) NF1 [Ejerskov et al 2021]. Mosaic NF1 is usually milder than typical NF1 involving the same pathogenic variant, and some adults with mosaic NF1 have no clinical features of NF1 [Kluwe et al 2020, Threasa Beards et al 2020]. Mosaic NF1 involving just a single body segment is sometimes called "segmental NF1," but the term "localized mosaic NF1" is preferred because it is more informative and because non-mosaic NF1 may sometimes involve just one part of the body by chance, especially in young children. Adults with mosaic NF1 may have children with typical (i.e., non-mosaic) NF1 [Legius & Brems 2020] (see Genetic Counseling).

## 2024-04-25 DIAGNOSIS — Z1389 Encounter for screening for other disorder: Secondary | ICD-10-CM | POA: Diagnosis not present

## 2024-04-25 DIAGNOSIS — Z01419 Encounter for gynecological examination (general) (routine) without abnormal findings: Secondary | ICD-10-CM | POA: Diagnosis not present

## 2024-04-25 DIAGNOSIS — L700 Acne vulgaris: Secondary | ICD-10-CM | POA: Diagnosis not present

## 2024-04-25 DIAGNOSIS — E282 Polycystic ovarian syndrome: Secondary | ICD-10-CM | POA: Diagnosis not present

## 2024-04-25 DIAGNOSIS — Z309 Encounter for contraceptive management, unspecified: Secondary | ICD-10-CM | POA: Diagnosis not present

## 2024-04-25 DIAGNOSIS — Z130 Encounter for screening for diseases of the blood and blood-forming organs and certain disorders involving the immune mechanism: Secondary | ICD-10-CM | POA: Diagnosis not present

## 2024-04-25 DIAGNOSIS — Q8501 Neurofibromatosis, type 1: Secondary | ICD-10-CM | POA: Diagnosis not present

## 2024-06-25 ENCOUNTER — Other Ambulatory Visit: Payer: Self-pay | Admitting: Internal Medicine

## 2024-06-25 DIAGNOSIS — N644 Mastodynia: Secondary | ICD-10-CM

## 2024-07-01 ENCOUNTER — Ambulatory Visit (INDEPENDENT_AMBULATORY_CARE_PROVIDER_SITE_OTHER): Payer: Self-pay | Admitting: Plastic Surgery

## 2024-07-01 VITALS — BP 104/70 | HR 79 | Ht 68.0 in | Wt 146.4 lb

## 2024-07-01 DIAGNOSIS — Z719 Counseling, unspecified: Secondary | ICD-10-CM | POA: Insufficient documentation

## 2024-07-01 DIAGNOSIS — Q859 Phakomatosis, unspecified: Secondary | ICD-10-CM

## 2024-07-01 DIAGNOSIS — Q8501 Neurofibromatosis, type 1: Secondary | ICD-10-CM

## 2024-07-01 NOTE — Progress Notes (Signed)
     Patient ID: Grace Strong, female    DOB: June 25, 2001, 23 y.o.   MRN: [id number]   Chief Complaint  Patient presents with   Laser Consultation    The patient is a 23 year old female here for evaluation of her face.  She has had some redness in her face before.  By her description it sounds like rosacea.  She had laser with Dr. Verneda and responded really well.  Now she notices a red area on the left cheek that seems to be getting a little bit raised and a little bit larger.  She is interested to see if this could be taken care of.  She also notices that she is getting red cheeks again.  She has headaches and possibly polycystic ovary disease but that is not for sure.    Review of Systems  Constitutional: Negative.   Eyes: Negative.   Respiratory: Negative.    Cardiovascular: Negative.   Gastrointestinal: Negative.   Genitourinary: Negative.     Past Medical History:  Diagnosis Date   Headache(784.0)    HSV-1 (herpes simplex virus 1) infection    Described by mother as recurrent fever blisters    Past Surgical History:  Procedure Laterality Date   WISDOM TOOTH EXTRACTION     2016      Current Outpatient Medications:    JUNEL FE 1/20 1-20 MG-MCG tablet, Take 1 tablet by mouth daily., Disp: , Rfl:    Multiple Vitamins-Minerals (MULTIVITAMIN WITH MINERALS) tablet, Take 1 tablet by mouth daily., Disp: , Rfl:    spironolactone (ALDACTONE) 25 MG tablet, Take 25 mg by mouth daily., Disp: , Rfl:    UNABLE TO FIND, Nutrafol for hairloss, Disp: , Rfl:    Vitamin D , Ergocalciferol , (DRISDOL) 50000 UNITS CAPS capsule, Take 5,000 Units by mouth 3 (three) times a week., Disp: , Rfl:    valACYclovir (VALTREX) 500 MG tablet, Take 500 mg by mouth 2 (two) times daily. (Patient not taking: Reported on 07/01/2024), Disp: , Rfl:    Objective:   Vitals:   07/01/24 1327  BP: 104/70  Pulse: 79  SpO2: 98%    Physical Exam HENT:     Head:   Neurological:     Mental Status: She is  oriented to person, place, and time.  Psychiatric:        Mood and Affect: Mood normal.        Behavior: Behavior normal.        Thought Content: Thought content normal.        Judgment: Judgment normal.     Assessment & Plan:  Encounter for counseling  Neurofibromatosis, type 1 (HCC)  Neurocutaneous syndrome (HCC)  The patient is a really good candidate for the laser.  I had like to go ahead and give her 1 treatment see how she does and then we can decide if she wants to do more.  Pictures were obtained of the patient and placed in the chart with the patient's or guardian's permission.   Estefana RAMAN Suesan Mohrmann, DO

## 2024-07-02 ENCOUNTER — Ambulatory Visit (INDEPENDENT_AMBULATORY_CARE_PROVIDER_SITE_OTHER): Payer: Self-pay | Admitting: Plastic Surgery

## 2024-07-02 DIAGNOSIS — Z719 Counseling, unspecified: Secondary | ICD-10-CM

## 2024-07-02 NOTE — Progress Notes (Signed)
 Preoperative Dx: Rosacea and redness of face  Postoperative Dx:  same  Procedure: laser to face  Anesthesia: none  Description of Procedure:  Risks and complications were explained to the patient. Consent was confirmed and signed. Eye protection was placed. Time out was called and all information was confirmed to be correct. The area  area was prepped with alcohol and wiped dry. The heroic laser was set at 560 and 7 J/cm2. The face was lasered.  Additional targets were done on the red area of the left cheek with the small handpiece.  The patient tolerated the procedure well and there were no complications. The patient is to follow up in 4 weeks.

## 2024-07-23 ENCOUNTER — Ambulatory Visit
Admission: RE | Admit: 2024-07-23 | Discharge: 2024-07-23 | Disposition: A | Discharge status: Home / Self Care | Source: Ambulatory Visit | Attending: Internal Medicine | Admitting: Internal Medicine

## 2024-07-23 DIAGNOSIS — N644 Mastodynia: Secondary | ICD-10-CM | POA: Diagnosis not present

## 2024-07-23 DIAGNOSIS — Z803 Family history of malignant neoplasm of breast: Secondary | ICD-10-CM | POA: Diagnosis not present

## 2024-07-23 DIAGNOSIS — E010 Iodine-deficiency related diffuse (endemic) goiter: Secondary | ICD-10-CM | POA: Diagnosis not present

## 2024-07-23 DIAGNOSIS — E049 Nontoxic goiter, unspecified: Secondary | ICD-10-CM | POA: Diagnosis not present

## 2024-08-27 DIAGNOSIS — Z8379 Family history of other diseases of the digestive system: Secondary | ICD-10-CM | POA: Diagnosis not present

## 2024-08-27 DIAGNOSIS — R002 Palpitations: Secondary | ICD-10-CM | POA: Diagnosis not present

## 2024-08-27 DIAGNOSIS — Z8349 Family history of other endocrine, nutritional and metabolic diseases: Secondary | ICD-10-CM | POA: Diagnosis not present

## 2024-08-27 DIAGNOSIS — L659 Nonscarring hair loss, unspecified: Secondary | ICD-10-CM | POA: Diagnosis not present

## 2024-08-27 DIAGNOSIS — N911 Secondary amenorrhea: Secondary | ICD-10-CM | POA: Diagnosis not present

## 2024-08-27 DIAGNOSIS — R490 Dysphonia: Secondary | ICD-10-CM | POA: Diagnosis not present

## 2024-08-27 DIAGNOSIS — R7989 Other specified abnormal findings of blood chemistry: Secondary | ICD-10-CM | POA: Diagnosis not present

## 2024-08-27 DIAGNOSIS — E221 Hyperprolactinemia: Secondary | ICD-10-CM | POA: Diagnosis not present

## 2024-08-27 DIAGNOSIS — E049 Nontoxic goiter, unspecified: Secondary | ICD-10-CM | POA: Diagnosis not present

## 2024-08-27 DIAGNOSIS — Z8269 Family history of other diseases of the musculoskeletal system and connective tissue: Secondary | ICD-10-CM | POA: Diagnosis not present

## 2024-08-27 DIAGNOSIS — Q8500 Neurofibromatosis, unspecified: Secondary | ICD-10-CM | POA: Diagnosis not present

## 2024-09-01 DIAGNOSIS — L659 Nonscarring hair loss, unspecified: Secondary | ICD-10-CM | POA: Diagnosis not present

## 2024-09-01 DIAGNOSIS — N911 Secondary amenorrhea: Secondary | ICD-10-CM | POA: Diagnosis not present

## 2024-09-01 DIAGNOSIS — E049 Nontoxic goiter, unspecified: Secondary | ICD-10-CM | POA: Diagnosis not present

## 2024-09-01 DIAGNOSIS — E221 Hyperprolactinemia: Secondary | ICD-10-CM | POA: Diagnosis not present

## 2024-09-05 ENCOUNTER — Other Ambulatory Visit: Payer: Self-pay | Admitting: Internal Medicine

## 2024-09-05 DIAGNOSIS — N911 Secondary amenorrhea: Secondary | ICD-10-CM

## 2024-09-05 DIAGNOSIS — E221 Hyperprolactinemia: Secondary | ICD-10-CM

## 2024-09-05 DIAGNOSIS — Q8500 Neurofibromatosis, unspecified: Secondary | ICD-10-CM

## 2024-09-22 ENCOUNTER — Other Ambulatory Visit

## 2024-09-22 DIAGNOSIS — E221 Hyperprolactinemia: Secondary | ICD-10-CM

## 2024-09-22 DIAGNOSIS — Q8500 Neurofibromatosis, unspecified: Secondary | ICD-10-CM

## 2024-09-22 DIAGNOSIS — N911 Secondary amenorrhea: Secondary | ICD-10-CM

## 2024-09-22 MED ORDER — GADOBUTROL 1 MMOL/ML IV SOLN
6.6000 mL | Freq: Once | INTRAVENOUS | Status: AC | PRN
  Administered 2024-09-22: 6.6 mL via INTRAVENOUS

## 2024-09-24 ENCOUNTER — Other Ambulatory Visit

## 2024-10-02 DIAGNOSIS — L658 Other specified nonscarring hair loss: Secondary | ICD-10-CM | POA: Diagnosis not present
